# Patient Record
Sex: Male | Born: 1955 | Race: White | Hispanic: No | Marital: Married | Smoking: Never smoker
Health system: Southern US, Community
[De-identification: ages and names within clinical notes are randomized; demographics above are authoritative.]

## PROBLEM LIST (undated history)

## (undated) DIAGNOSIS — E119 Type 2 diabetes mellitus without complications: Secondary | ICD-10-CM

## (undated) DIAGNOSIS — E079 Disorder of thyroid, unspecified: Secondary | ICD-10-CM

## (undated) DIAGNOSIS — I219 Acute myocardial infarction, unspecified: Secondary | ICD-10-CM

## (undated) DIAGNOSIS — J45909 Unspecified asthma, uncomplicated: Secondary | ICD-10-CM

## (undated) DIAGNOSIS — C189 Malignant neoplasm of colon, unspecified: Secondary | ICD-10-CM

## (undated) DIAGNOSIS — I10 Essential (primary) hypertension: Secondary | ICD-10-CM

## (undated) HISTORY — PX: COLON SURGERY: SHX602

## (undated) HISTORY — PX: CORONARY ANGIOPLASTY WITH STENT PLACEMENT: SHX49

## (undated) HISTORY — PX: ANKLE SURGERY: SHX546

---

## 2019-11-24 ENCOUNTER — Encounter (HOSPITAL_BASED_OUTPATIENT_CLINIC_OR_DEPARTMENT_OTHER): Payer: Self-pay

## 2019-11-24 ENCOUNTER — Emergency Department (HOSPITAL_BASED_OUTPATIENT_CLINIC_OR_DEPARTMENT_OTHER): Payer: Medicare HMO

## 2019-11-24 ENCOUNTER — Other Ambulatory Visit: Payer: Self-pay

## 2019-11-24 ENCOUNTER — Emergency Department (HOSPITAL_BASED_OUTPATIENT_CLINIC_OR_DEPARTMENT_OTHER)
Admission: EM | Admit: 2019-11-24 | Discharge: 2019-11-24 | Disposition: A | Discharge status: Home / Self Care | Payer: Medicare HMO | Attending: Emergency Medicine | Admitting: Emergency Medicine

## 2019-11-24 DIAGNOSIS — Z7982 Long term (current) use of aspirin: Secondary | ICD-10-CM | POA: Diagnosis not present

## 2019-11-24 DIAGNOSIS — I10 Essential (primary) hypertension: Secondary | ICD-10-CM | POA: Insufficient documentation

## 2019-11-24 DIAGNOSIS — E119 Type 2 diabetes mellitus without complications: Secondary | ICD-10-CM | POA: Diagnosis not present

## 2019-11-24 DIAGNOSIS — R42 Dizziness and giddiness: Secondary | ICD-10-CM | POA: Insufficient documentation

## 2019-11-24 DIAGNOSIS — R519 Headache, unspecified: Secondary | ICD-10-CM | POA: Diagnosis present

## 2019-11-24 DIAGNOSIS — R0789 Other chest pain: Secondary | ICD-10-CM | POA: Insufficient documentation

## 2019-11-24 DIAGNOSIS — Z85038 Personal history of other malignant neoplasm of large intestine: Secondary | ICD-10-CM | POA: Insufficient documentation

## 2019-11-24 HISTORY — DX: Acute myocardial infarction, unspecified: I21.9

## 2019-11-24 HISTORY — DX: Disorder of thyroid, unspecified: E07.9

## 2019-11-24 HISTORY — DX: Malignant neoplasm of colon, unspecified: C18.9

## 2019-11-24 HISTORY — DX: Type 2 diabetes mellitus without complications: E11.9

## 2019-11-24 HISTORY — DX: Essential (primary) hypertension: I10

## 2019-11-24 LAB — CBC WITH DIFFERENTIAL/PLATELET
Abs Immature Granulocytes: 0.03 10*3/uL (ref 0.00–0.07)
Basophils Absolute: 0.1 10*3/uL (ref 0.0–0.1)
Basophils Relative: 1 %
Eosinophils Absolute: 0.2 10*3/uL (ref 0.0–0.5)
Eosinophils Relative: 2 %
HCT: 41.1 % (ref 39.0–52.0)
Hemoglobin: 14.5 g/dL (ref 13.0–17.0)
Immature Granulocytes: 1 %
Lymphocytes Relative: 20 %
Lymphs Abs: 1.3 10*3/uL (ref 0.7–4.0)
MCH: 31 pg (ref 26.0–34.0)
MCHC: 35.3 g/dL (ref 30.0–36.0)
MCV: 87.8 fL (ref 80.0–100.0)
Monocytes Absolute: 0.5 10*3/uL (ref 0.1–1.0)
Monocytes Relative: 8 %
Neutro Abs: 4.4 10*3/uL (ref 1.7–7.7)
Neutrophils Relative %: 68 %
Platelets: 235 10*3/uL (ref 150–400)
RBC: 4.68 MIL/uL (ref 4.22–5.81)
RDW: 12.2 % (ref 11.5–15.5)
WBC: 6.5 10*3/uL (ref 4.0–10.5)
nRBC: 0 % (ref 0.0–0.2)

## 2019-11-24 LAB — BASIC METABOLIC PANEL
Anion gap: 11 (ref 5–15)
BUN: 14 mg/dL (ref 8–23)
CO2: 26 mmol/L (ref 22–32)
Calcium: 9.2 mg/dL (ref 8.9–10.3)
Chloride: 102 mmol/L (ref 98–111)
Creatinine, Ser: 1 mg/dL (ref 0.61–1.24)
GFR calc Af Amer: >60 mL/min (ref >60)
GFR calc non Af Amer: >60 mL/min (ref >60)
Glucose, Bld: 106 mg/dL — ABNORMAL HIGH (ref 70–99)
Potassium: 3.8 mmol/L (ref 3.5–5.1)
Sodium: 139 mmol/L (ref 135–145)

## 2019-11-24 LAB — TROPONIN I (HIGH SENSITIVITY): Troponin I (High Sensitivity): 5 ng/L (ref <18)

## 2019-11-24 NOTE — Discharge Instructions (Signed)
Please read and follow all provided instructions.  Your diagnoses today include:  1. Acute nonintractable headache, unspecified headache type   2. Chest discomfort   3. Dizziness     Tests performed today include:  CT of your head which was normal and did not show any serious cause of your headache  EKG and cardiac enzyme -did not show any signs of heart attack or stress on the heart  Blood counts and electrolytes  Vital signs. See below for your results today.   Medications:   None  Take any prescribed medications only as directed.  Additional information:  Follow any educational materials contained in this packet.  You are having a headache. No specific cause was found today for your headache. It may have been a migraine or other cause of headache. Stress, anxiety, fatigue, and depression are common triggers for headaches.   Your headache today does not appear to be life-threatening or require hospitalization, but often the exact cause of headaches is not determined in the emergency department. Therefore, follow-up with your doctor is very important to find out what may have caused your headache and whether or not you need any further diagnostic testing or treatment.   Sometimes headaches can appear benign (not harmful), but then more serious symptoms can develop which should prompt an immediate re-evaluation by your doctor or the emergency department.  BE VERY CAREFUL not to take multiple medicines containing Tylenol (also called acetaminophen). Doing so can lead to an overdose which can damage your liver and cause liver failure and possibly death.   Follow-up instructions: Please follow-up with your primary care provider in the next 3 days for further evaluation of your symptoms.   Return instructions:   Please return to the Emergency Department if you experience worsening symptoms.  Return if the medications do not resolve your headache, if it recurs, or if you have  multiple episodes of vomiting or cannot keep down fluids.  Call 9-1-1 if you have new weakness in your arms or legs, slurred speech, trouble walking or talking, confusion, or trouble with your balance.   Return if you have a change from the usual headache.  RETURN IMMEDIATELY IF you:  Develop a sudden, severe headache  Develop confusion or become poorly responsive or faint  Develop a fever above 100.10F or problem breathing  Have a change in speech, vision, swallowing, or understanding  Develop new weakness, numbness, tingling, incoordination in your arms or legs  Have a seizure  Please return if you have any other emergent concerns.  Additional Information:  Your vital signs today were: BP (!) 142/79 (BP Location: Right Arm)   Pulse 63   Temp 97.9 F (36.6 C) (Oral)   Resp 18   Ht 5\' 9"  (1.753 m)   Wt 80.3 kg   SpO2 99%   BMI 26.14 kg/m  If your blood pressure (BP) was elevated above 135/85 this visit, please have this repeated by your doctor within one month. --------------

## 2019-11-24 NOTE — ED Triage Notes (Signed)
Pt c/o intermittent HA, dizziness, blurred vision-sx started 6/30, the day after an eye exam-denies injury-NAD-steady

## 2019-11-24 NOTE — ED Provider Notes (Signed)
[facility] EMERGENCY DEPARTMENT Provider Note   CSN: [id number] Arrival date & time: 11/24/19  1600     History Chief Complaint  Patient presents with  . Headache    [facility] is a 64 y.o. male.  Patient with history of coronary artery disease and MI status post stenting, currently on baby aspirin no anticoagulation, history of hypertension --presents to the emergency department with complaint of intermittent headache, dizziness, blurry vision.  Patient states that he has had difficulty with his eyesight being blurry on the right and his eye feeling 'swollen' since April.  Patient followed up and had a dilated eye exam 3 days ago and was told that he had a cataract.  The following morning he woke up with dizziness described as difficulty walking as well as a spinning sensation.  This was severe but then improved.  Patient states that yesterday developed headache across the front of his head.  This is atypical for him.  He continued to feel very generally weak and lightheaded but has not passed out.  He has had some mild episodes of chest discomfort, not similar to previous pain that he had with his heart attack.  The symptoms are mild.  Patient denies signs of stroke including: facial droop, slurred speech, aphasia, weakness/numbness in extremities.  States that he went to an outside provider and was referred to the emergency department for further evaluation.  They were concerns of potential ischemic/hemorrhagic stroke, ACS.         Past Medical History:  Diagnosis Date  . Colon cancer ([place])   . Diabetes mellitus without complication ([place])   . Heart attack ([place])   . Hypertension   . Thyroid disease     There are no problems to display for this patient.   Past Surgical History:  Procedure Laterality Date  . ANKLE SURGERY    . COLON SURGERY    . CORONARY ANGIOPLASTY WITH STENT PLACEMENT         No family history on file.  Social History   Tobacco Use  .  Smoking status: Never Smoker  . Smokeless tobacco: Never Used  Vaping Use  . Vaping Use: Never used  Substance Use Topics  . Alcohol use: Never  . Drug use: Never    Home Medications Prior to Admission medications   Not on File    Allergies    Flomax [tamsulosin] and Zosyn [piperacillin sod-tazobactam so]  Review of Systems   Review of Systems  Constitutional: Positive for fatigue. Negative for fever.  HENT: Negative for congestion, dental problem, rhinorrhea and sinus pressure.   Eyes: Positive for visual disturbance. Negative for photophobia, discharge and redness.  Respiratory: Negative for shortness of breath.   Cardiovascular: Positive for chest pain.  Gastrointestinal: Negative for nausea and vomiting.  Musculoskeletal: Negative for gait problem, neck pain and neck stiffness.  Skin: Negative for rash.  Neurological: Positive for dizziness, light-headedness and headaches. Negative for seizures, syncope, facial asymmetry, speech difficulty, weakness and numbness.  Psychiatric/Behavioral: Negative for confusion.    Physical Exam Updated Vital Signs BP (!) 142/79 (BP Location: Right Arm)   Pulse 63   Temp 97.9 F (36.6 C) (Oral)   Resp 18   Ht '5\' 9"'  (1.753 m)   Wt 80.3 kg   SpO2 99%   BMI 26.14 kg/m   Physical Exam Vitals and nursing note reviewed.  Constitutional:      Appearance: He is well-developed.  HENT:     Head: Normocephalic  and atraumatic.     Right Ear: Tympanic membrane, ear canal and external ear normal.     Left Ear: Tympanic membrane, ear canal and external ear normal.     Nose: Nose normal.     Mouth/Throat:     Pharynx: Uvula midline.  Eyes:     General: Lids are normal.     Conjunctiva/sclera: Conjunctivae normal.     Pupils: Pupils are equal, round, and reactive to light.     Comments: Patient with normal symmetric pupils, reactive to light.  Negative swinging flashlight test.  Neck:     Comments: No carotid bruits  heard. Cardiovascular:     Rate and Rhythm: Normal rate and regular rhythm.  Pulmonary:     Effort: Pulmonary effort is normal.     Breath sounds: Normal breath sounds.  Abdominal:     Palpations: Abdomen is soft.     Tenderness: There is no abdominal tenderness.  Musculoskeletal:        General: Normal range of motion.     Cervical back: Normal range of motion and neck supple. No tenderness or bony tenderness.  Skin:    General: Skin is warm and dry.  Neurological:     Mental Status: He is alert and oriented to person, place, and time.     GCS: GCS eye subscore is 4. GCS verbal subscore is 5. GCS motor subscore is 6.     Cranial Nerves: No cranial nerve deficit.     Sensory: No sensory deficit.     Motor: No abnormal muscle tone.     Coordination: Coordination normal.     Gait: Gait normal.     Deep Tendon Reflexes: Reflexes are normal and symmetric.     Comments: I met the patient when he was being walked back to the room.  He was walking with a normal gait with normal coordination, actively using his cell phone upon entering the room.     ED Results / Procedures / Treatments   Labs (all labs ordered are listed, but only abnormal results are displayed) Labs Reviewed  BASIC METABOLIC PANEL - Abnormal; Notable for the following components:      Result Value   Glucose, Bld 106 (*)    All other components within normal limits  CBC WITH DIFFERENTIAL/PLATELET  TROPONIN I (HIGH SENSITIVITY)    EKG EKG Interpretation  Date/Time:  Friday November 24 2019 16:08:50 EDT Ventricular Rate:  55 PR Interval:  162 QRS Duration: 80 QT Interval:  428 QTC Calculation: 409 R Axis:   68 Text Interpretation: Sinus bradycardia Otherwise normal ECG No old tracing to compare Confirmed by Isla Pence [phone]) on 11/24/2019 6:08:55 PM   Radiology CT Head Wo Contrast  Result Date: 11/24/2019 CLINICAL DATA:  Headache EXAM: CT HEAD WITHOUT CONTRAST TECHNIQUE: Contiguous axial images were  obtained from the base of the skull through the vertex without intravenous contrast. COMPARISON:  None. FINDINGS: Brain: No acute territorial infarction, hemorrhage or intracranial mass is visualized. Mild to moderate atrophy. Nonenlarged ventricles. Vascular: No hyperdense vessels.  No unexpected calcification Skull: Normal. Negative for fracture or focal lesion. Sinuses/Orbits: No acute finding. Other: None IMPRESSION: 1. No CT evidence for acute intracranial abnormality. 2. Atrophy. Electronically Signed   By: Donavan Foil M.D.   On: 11/24/2019 18:57    Procedures Procedures (including critical care time)  Medications Ordered in ED Medications - No data to display  ED Course  I have reviewed the triage vital signs and  the nursing notes.  Pertinent labs & imaging results that were available during my care of the patient were reviewed by me and considered in my medical decision making (see chart for details).  Patient seen and examined.  Reviewed outside provider note.  Labs ordered upon arrival were reviewed and were normal.  Troponin added.  EKG nonischemic.  Given atypical headache, age greater than 46, CT of the head ordered to evaluate for bleeding.  Patient has a completely normal neurologic exam at time of initial evaluation.  Vital signs reviewed and are as follows: BP (!) 142/79 (BP Location: Right Arm)   Pulse 63   Temp 97.9 F (36.6 C) (Oral)   Resp 18   Ht '5\' 9"'  (1.753 m)   Wt 80.3 kg   SpO2 99%   BMI 26.14 kg/m   8:22 PM patient stable during ED stay.  Reports slight headache currently.  We discussed reassuring head CT, troponin, labs, EKG.  Discussed that the dizziness he had 2 days ago may be related to his eye exam if he was still having pupillary dilation.  However, we did discuss at length, with wife at bedside, possibility of TIA.  I do not feel strongly that patient needs to be admitted for TIA work-up at this time given other potential etiologies of his symptoms,  however did discuss need for close monitoring at home and PCP follow-up.  Patient counseled to return immediately if they have weakness in their arms or legs, slurred speech, trouble walking or talking, confusion, trouble with their balance, or if they have any other concerns. Patient verbalizes understanding and agrees with plan.       MDM Rules/Calculators/A&P                          Patient with multiple symptoms including intermittent headaches, dizziness and trouble walking, lightheadedness, blurred vision.  Recent ophthalmologic work-up.  Blurry vision has been ongoing and patient was recently diagnosed with a cataract in his eye.  Most concerning symptom is patient's reported ataxia which occurred the morning after his eye exam.  This is now resolved.  CT of the head is reassuring.  No signs of bleeding.  Patient reported some chest discomfort.  EKG is nonischemic and troponin is negative x1.  No active pain.  Pain is not similar at all to his previous heart attack.  At this point, I do not feel that the patient requires admission.  However, we discussed signs and symptoms of TIA and stroke and need to return immediately if any of these occur.  Feel patient is low risk for TIA or stroke at this point.  Patient is comfortable discharged home and seems reliable to return if any symptoms get worse or recur.  Wife at bedside is in agreement and will monitor.   Final Clinical Impression(s) / ED Diagnoses Final diagnoses:  Acute nonintractable headache, unspecified headache type  Chest discomfort  Dizziness    Rx / DC Orders ED Discharge Orders    None       Suann Larry 11/24/19 2027    Isla Pence, MD 11/24/19 2041

## 2020-06-08 ENCOUNTER — Emergency Department (HOSPITAL_BASED_OUTPATIENT_CLINIC_OR_DEPARTMENT_OTHER)
Admission: EM | Admit: 2020-06-08 | Discharge: 2020-06-08 | Disposition: A | Discharge status: Home / Self Care | Payer: Medicare HMO | Attending: Emergency Medicine | Admitting: Emergency Medicine

## 2020-06-08 ENCOUNTER — Emergency Department (HOSPITAL_BASED_OUTPATIENT_CLINIC_OR_DEPARTMENT_OTHER): Payer: Medicare HMO

## 2020-06-08 ENCOUNTER — Other Ambulatory Visit: Payer: Self-pay

## 2020-06-08 DIAGNOSIS — Z955 Presence of coronary angioplasty implant and graft: Secondary | ICD-10-CM | POA: Diagnosis not present

## 2020-06-08 DIAGNOSIS — E119 Type 2 diabetes mellitus without complications: Secondary | ICD-10-CM | POA: Insufficient documentation

## 2020-06-08 DIAGNOSIS — R0602 Shortness of breath: Secondary | ICD-10-CM | POA: Diagnosis present

## 2020-06-08 DIAGNOSIS — I2510 Atherosclerotic heart disease of native coronary artery without angina pectoris: Secondary | ICD-10-CM | POA: Diagnosis not present

## 2020-06-08 DIAGNOSIS — I10 Essential (primary) hypertension: Secondary | ICD-10-CM | POA: Diagnosis not present

## 2020-06-08 DIAGNOSIS — Z20822 Contact with and (suspected) exposure to covid-19: Secondary | ICD-10-CM | POA: Insufficient documentation

## 2020-06-08 DIAGNOSIS — Z85038 Personal history of other malignant neoplasm of large intestine: Secondary | ICD-10-CM | POA: Diagnosis not present

## 2020-06-08 DIAGNOSIS — J4541 Moderate persistent asthma with (acute) exacerbation: Secondary | ICD-10-CM | POA: Insufficient documentation

## 2020-06-08 DIAGNOSIS — R0789 Other chest pain: Secondary | ICD-10-CM

## 2020-06-08 HISTORY — DX: Unspecified asthma, uncomplicated: J45.909

## 2020-06-08 LAB — CBC WITH DIFFERENTIAL/PLATELET
Abs Immature Granulocytes: 0.02 10*3/uL (ref 0.00–0.07)
Basophils Absolute: 0.1 10*3/uL (ref 0.0–0.1)
Basophils Relative: 1 %
Eosinophils Absolute: 0.1 10*3/uL (ref 0.0–0.5)
Eosinophils Relative: 1 %
HCT: 39.5 % (ref 39.0–52.0)
Hemoglobin: 14.3 g/dL (ref 13.0–17.0)
Immature Granulocytes: 0 %
Lymphocytes Relative: 18 %
Lymphs Abs: 1.3 10*3/uL (ref 0.7–4.0)
MCH: 30.8 pg (ref 26.0–34.0)
MCHC: 36.2 g/dL — ABNORMAL HIGH (ref 30.0–36.0)
MCV: 85.1 fL (ref 80.0–100.0)
Monocytes Absolute: 0.4 10*3/uL (ref 0.1–1.0)
Monocytes Relative: 6 %
Neutro Abs: 5.3 10*3/uL (ref 1.7–7.7)
Neutrophils Relative %: 74 %
Platelets: 253 10*3/uL (ref 150–400)
RBC: 4.64 MIL/uL (ref 4.22–5.81)
RDW: 12.5 % (ref 11.5–15.5)
WBC: 7.2 10*3/uL (ref 4.0–10.5)
nRBC: 0 % (ref 0.0–0.2)

## 2020-06-08 LAB — BASIC METABOLIC PANEL
Anion gap: 9 (ref 5–15)
BUN: 11 mg/dL (ref 8–23)
CO2: 28 mmol/L (ref 22–32)
Calcium: 9.3 mg/dL (ref 8.9–10.3)
Chloride: 101 mmol/L (ref 98–111)
Creatinine, Ser: 1.11 mg/dL (ref 0.61–1.24)
GFR, Estimated: >60 mL/min (ref >60)
Glucose, Bld: 126 mg/dL — ABNORMAL HIGH (ref 70–99)
Potassium: 3.9 mmol/L (ref 3.5–5.1)
Sodium: 138 mmol/L (ref 135–145)

## 2020-06-08 LAB — SARS CORONAVIRUS 2 (TAT 6-24 HRS): SARS Coronavirus 2: NEGATIVE

## 2020-06-08 LAB — TROPONIN I (HIGH SENSITIVITY)
Troponin I (High Sensitivity): 4 ng/L (ref <18)
Troponin I (High Sensitivity): 4 ng/L (ref <18)

## 2020-06-08 LAB — BRAIN NATRIURETIC PEPTIDE: B Natriuretic Peptide: 22.3 pg/mL (ref 0.0–100.0)

## 2020-06-08 LAB — LACTATE DEHYDROGENASE: LDH: 145 U/L (ref 98–192)

## 2020-06-08 LAB — PROCALCITONIN: Procalcitonin: <0.1 ng/mL

## 2020-06-08 LAB — C-REACTIVE PROTEIN: CRP: <0.5 mg/dL (ref <1.0)

## 2020-06-08 LAB — D-DIMER, QUANTITATIVE: D-Dimer, Quant: 0.4 ug/mL-FEU (ref 0.00–0.50)

## 2020-06-08 MED ORDER — PREDNISONE 50 MG PO TABS: 60.0000 mg | ORAL_TABLET | Freq: Once | ORAL | Status: DC

## 2020-06-08 MED ORDER — PREDNISONE 10 MG PO TABS: 50.0000 mg | ORAL_TABLET | Freq: Every day | ORAL | 0 refills | Status: AC

## 2020-06-08 NOTE — Discharge Instructions (Signed)
We saw you in the ER for chest tightness.   Cardiac work-up is normal.  No evidence of CHF either.  Chest x-ray does not show any pneumonia. We have sent outpatient COVID-19 test, the results should be back by this evening.  We recommend that you isolate until the test results come back negative.  If the test is positive, then you will need to isolate for 5 days, and thereafter use a mask when in public for at least 5 more days at all times.  Please take albuterol as needed every 4 hours. Please take the medications prescribed. Please refrain from smoking or smoke exposure. Please see a primary care doctor in 1 week. Return to the ER if your symptoms worsen.

## 2020-06-08 NOTE — ED Triage Notes (Signed)
SOB with chest tightness since yesterday. Using inhalers without relief.

## 2020-06-08 NOTE — ED Provider Notes (Addendum)
[facility] Provider Note   CSN: [id number] Arrival date & time: 06/08/20  8315     History Chief Complaint  Patient presents with  . Shortness of Breath    [facility] is a 65 y.o. male.  HPI    65 year old male comes in a chief complaint of shortness of breath.  He has history of CAD, hypertension, asthma, diabetes.  Over the last couple of days patient has been having shortness of breath and chest discomfort.  Chest discomfort is central and radiates outwards.  No radiation to the back, shoulder, neck.  Pain is described as burning type pain.  Patient also has shortness of breath with exertion.  Overall he it appears to him that the symptoms are similar to his asthma, however he has taken multiple breathing treatments without any relief.  Patient has received full vaccination against COVID-19.  He denies any known sick exposure. Pt has no hx of PE, DVT and denies any exogenous hormone (testosterone / estrogen) use, long distance travels or surgery in the past 6 weeks, active cancer, recent immobilization. Patient has pain with ACS was heaviness, like something was sitting on him.  Past Medical History:  Diagnosis Date  . Asthma   . Colon cancer ([place])   . Diabetes mellitus without complication ([place])   . Heart attack ([place])   . Hypertension   . Thyroid disease     There are no problems to display for this patient.   Past Surgical History:  Procedure Laterality Date  . ANKLE SURGERY    . COLON SURGERY    . CORONARY ANGIOPLASTY WITH STENT PLACEMENT         No family history on file.  Social History   Tobacco Use  . Smoking status: Never Smoker  . Smokeless tobacco: Never Used  Vaping Use  . Vaping Use: Never used  Substance Use Topics  . Alcohol use: Never  . Drug use: Never    Home Medications Prior to Admission medications   Medication Sig Start Date End Date Taking? Authorizing Provider  predniSONE (DELTASONE) 10 MG  tablet Take 5 tablets (50 mg total) by mouth daily. 06/09/20  Yes Varney Biles, MD    Allergies    Flomax [tamsulosin] and Zosyn [piperacillin sod-tazobactam so]  Review of Systems   Review of Systems  Constitutional: Positive for activity change.  Respiratory: Positive for shortness of breath.   Cardiovascular: Positive for chest pain.  Gastrointestinal: Negative for nausea and vomiting.  Neurological: Negative for dizziness.  All other systems reviewed and are negative.   Physical Exam Updated Vital Signs BP 133/87   Pulse 76   Temp 97.8 F (36.6 C) (Oral)   Resp 13   Ht 5\' 9"  (1.753 m)   Wt 79.4 kg   SpO2 99%   BMI 25.84 kg/m   Physical Exam Vitals and nursing note reviewed.  Constitutional:      Appearance: He is well-developed.  HENT:     Head: Atraumatic.  Cardiovascular:     Rate and Rhythm: Normal rate.  Pulmonary:     Effort: Pulmonary effort is normal.     Breath sounds: No decreased breath sounds or wheezing.  Musculoskeletal:     Cervical back: Neck supple.  Skin:    General: Skin is warm.  Neurological:     Mental Status: He is alert and oriented to person, place, and time.     ED Results / Procedures / Treatments   Labs (  all labs ordered are listed, but only abnormal results are displayed) Labs Reviewed  BASIC METABOLIC PANEL - Abnormal; Notable for the following components:      Result Value   Glucose, Bld 126 (*)    All other components within normal limits  CBC WITH DIFFERENTIAL/PLATELET - Abnormal; Notable for the following components:   MCHC 36.2 (*)    All other components within normal limits  SARS CORONAVIRUS 2 (TAT 6-24 HRS)  D-DIMER, QUANTITATIVE (NOT AT [facility])  C-REACTIVE PROTEIN  BRAIN NATRIURETIC PEPTIDE  LACTATE DEHYDROGENASE  PROCALCITONIN  TROPONIN I (HIGH SENSITIVITY)  TROPONIN I (HIGH SENSITIVITY)    EKG EKG Interpretation  Date/Time:  Saturday June 08 2020 08:38:24 EST Ventricular Rate:  82 PR Interval:     QRS Duration: 95 QT Interval:  389 QTC Calculation: 455 R Axis:   62 Text Interpretation: Sinus rhythm Ventricular trigeminy Abnormal R-wave progression, early transition No acute changes trigeminy is new Confirmed by Varney Biles [phone]) on 06/08/2020 9:09:03 AM   Radiology DG Chest Port 1 View  Result Date: 06/08/2020 CLINICAL DATA:  Shortness of breath and chest tightness 2-4 days. EXAM: PORTABLE CHEST 1 VIEW COMPARISON:  None. FINDINGS: Lungs are adequately inflated and otherwise clear. Cardiomediastinal silhouette, bones and soft tissues are normal. IMPRESSION: No active disease. Electronically Signed   By: Marin Olp M.D.   On: 06/08/2020 12:56    Procedures Procedures (including critical care time)  Medications Ordered in ED Medications  predniSONE (DELTASONE) tablet 60 mg (has no administration in time range)    ED Course  I have reviewed the triage vital signs and the nursing notes.  Pertinent labs & imaging results that were available during my care of the patient were reviewed by me and considered in my medical decision making (see chart for details).    MDM Rules/Calculators/A&P                         [facility] was evaluated in Emergency Department on 06/08/2020 for the symptoms described in the history of present illness. He was evaluated in the context of the global COVID-19 pandemic, which necessitated consideration that the patient might be at risk for infection with the SARS-CoV-2 virus that causes COVID-19. Institutional protocols and algorithms that pertain to the evaluation of patients at risk for COVID-19 are in a state of rapid change based on information released by regulatory bodies including the CDC and federal and state organizations. These policies and algorithms were followed during the patient's care in the ED.    65 year old comes in the chief complaint of chest tightness and shortness of breath.  Symptoms started yesterday.  He has history of  asthma, CAD.  He is vaccinated against COVID-19.  The chest pain is atypical and is not similar to his prior MI type pain.  EKG is not showing any acute ischemia.  There is ventricular trigeminy which is nonspecific.  We will get troponins, but suspicion for ACS is low.  Patient has exertional shortness of breath and feels chest tightness similar to asthma.  No wheezing on exam however.  PE, COVID-19 considered in the differential.  Chest x-ray ordered.  BNP ordered to rule out CHF.  1:07 PM D-dimer and initial high-sensitivity troponin is normal.  Chest x-ray is reassuring. We will order outpatient COVID-19 test.  I will also start patient on prednisone given that he feels like his symptoms are similar to his asthma exacerbation.  No antibiotics  The patient appears reasonably screened and/or stabilized for discharge and I doubt any other medical condition or other [facility] requiring further screening, evaluation, or treatment in the ED at this time prior to discharge.   Results from the ER workup discussed with the patient face to face and all questions answered to the best of my ability. The patient is safe for discharge with strict return precautions.   Final Clinical Impression(s) / ED Diagnoses Final diagnoses:  Moderate persistent asthma with exacerbation  Chest tightness    Rx / DC Orders ED Discharge Orders         Ordered    predniSONE (DELTASONE) 10 MG tablet  Daily        06/08/20 Russell, Otniel Hoe, MD 06/08/20 1158    Varney Biles, MD 06/08/20 1307

## 2020-11-18 ENCOUNTER — Encounter (HOSPITAL_BASED_OUTPATIENT_CLINIC_OR_DEPARTMENT_OTHER): Payer: Self-pay | Admitting: Urology

## 2020-11-18 ENCOUNTER — Emergency Department (HOSPITAL_BASED_OUTPATIENT_CLINIC_OR_DEPARTMENT_OTHER): Payer: Medicare HMO

## 2020-11-18 ENCOUNTER — Other Ambulatory Visit: Payer: Self-pay

## 2020-11-18 ENCOUNTER — Emergency Department (HOSPITAL_BASED_OUTPATIENT_CLINIC_OR_DEPARTMENT_OTHER)
Admission: EM | Admit: 2020-11-18 | Discharge: 2020-11-18 | Disposition: A | Discharge status: Home / Self Care | Payer: Medicare HMO | Attending: Emergency Medicine | Admitting: Emergency Medicine

## 2020-11-18 DIAGNOSIS — I10 Essential (primary) hypertension: Secondary | ICD-10-CM | POA: Insufficient documentation

## 2020-11-18 DIAGNOSIS — J45909 Unspecified asthma, uncomplicated: Secondary | ICD-10-CM | POA: Diagnosis not present

## 2020-11-18 DIAGNOSIS — R0789 Other chest pain: Secondary | ICD-10-CM | POA: Insufficient documentation

## 2020-11-18 DIAGNOSIS — R079 Chest pain, unspecified: Secondary | ICD-10-CM | POA: Diagnosis present

## 2020-11-18 DIAGNOSIS — Z7951 Long term (current) use of inhaled steroids: Secondary | ICD-10-CM | POA: Diagnosis not present

## 2020-11-18 DIAGNOSIS — Z7982 Long term (current) use of aspirin: Secondary | ICD-10-CM | POA: Insufficient documentation

## 2020-11-18 DIAGNOSIS — E119 Type 2 diabetes mellitus without complications: Secondary | ICD-10-CM | POA: Diagnosis not present

## 2020-11-18 DIAGNOSIS — Z85038 Personal history of other malignant neoplasm of large intestine: Secondary | ICD-10-CM | POA: Diagnosis not present

## 2020-11-18 DIAGNOSIS — Z79899 Other long term (current) drug therapy: Secondary | ICD-10-CM | POA: Diagnosis not present

## 2020-11-18 LAB — TROPONIN I (HIGH SENSITIVITY)
Troponin I (High Sensitivity): 4 ng/L (ref <18)
Troponin I (High Sensitivity): 5 ng/L (ref <18)

## 2020-11-18 LAB — BASIC METABOLIC PANEL
Anion gap: 10 (ref 5–15)
BUN: 13 mg/dL (ref 8–23)
CO2: 27 mmol/L (ref 22–32)
Calcium: 8.9 mg/dL (ref 8.9–10.3)
Chloride: 101 mmol/L (ref 98–111)
Creatinine, Ser: 0.97 mg/dL (ref 0.61–1.24)
GFR, Estimated: >60 mL/min (ref >60)
Glucose, Bld: 169 mg/dL — ABNORMAL HIGH (ref 70–99)
Potassium: 3.4 mmol/L — ABNORMAL LOW (ref 3.5–5.1)
Sodium: 138 mmol/L (ref 135–145)

## 2020-11-18 LAB — CBC
HCT: 36.9 % — ABNORMAL LOW (ref 39.0–52.0)
Hemoglobin: 13 g/dL (ref 13.0–17.0)
MCH: 30 pg (ref 26.0–34.0)
MCHC: 35.2 g/dL (ref 30.0–36.0)
MCV: 85.2 fL (ref 80.0–100.0)
Platelets: 239 10*3/uL (ref 150–400)
RBC: 4.33 MIL/uL (ref 4.22–5.81)
RDW: 12.8 % (ref 11.5–15.5)
WBC: 5.7 10*3/uL (ref 4.0–10.5)
nRBC: 0 % (ref 0.0–0.2)

## 2020-11-18 MED ORDER — SODIUM CHLORIDE 0.9 % IV BOLUS
1000.0000 mL | Freq: Once | INTRAVENOUS | Status: AC
  Administered 2020-11-18: 1000 mL via INTRAVENOUS

## 2020-11-18 MED ORDER — KETOROLAC TROMETHAMINE 15 MG/ML IJ SOLN
15.0000 mg | Freq: Once | INTRAMUSCULAR | Status: AC
  Administered 2020-11-18: 15 mg via INTRAVENOUS
  Filled 2020-11-18: qty 1

## 2020-11-18 MED ORDER — POTASSIUM CHLORIDE CRYS ER 20 MEQ PO TBCR
40.0000 meq | EXTENDED_RELEASE_TABLET | Freq: Once | ORAL | Status: AC
  Administered 2020-11-18: 40 meq via ORAL
  Filled 2020-11-18: qty 2

## 2020-11-18 NOTE — ED Triage Notes (Signed)
States left sided chest pain x 1 week after doing yard work, states worsening this morning, took asa 324 and Nitro SL at [employer] [place] with some relief but pain returning.  States intermittent light headedness as well.  H/O MI

## 2020-11-18 NOTE — ED Provider Notes (Addendum)
[facility] DEPARTMENT Provider Note   CSN: [id number] Arrival date & time: 11/18/20  0945     History Chief Complaint  Patient presents with   Chest Pain    Thomas Dillon is a 65 y.o. male.  HPI 65 year old male presents with chest pain.  It has been ongoing for about a week.  At first it was intermittent but now it seems constant.  It started the day after he did a lot of gardening in the yard.  He does not remember any pain during the gardening.  Since then has had a burning type pain in his left anterior chest that goes to the middle.  No radiation of the pain, diaphoresis, nausea, vomiting, shortness of breath.  He has had a heart attack that was about 6 years ago and he thinks this might feel similar but not nearly as bad.  There is no real exertional component.  Does not seem to get worse with certain movements.  This morning he went on a walk and then came back and did gardening and when he got back into the house he noticed the pain was increased at around 6:30 AM.  Took 4 baby aspirin and a nitroglycerin with partial improvement.  Right now the pain is about a 3 out of 10.  Past Medical History:  Diagnosis Date   Asthma    Colon cancer ([place])    Diabetes mellitus without complication ([place])    Heart attack ([place])    Hypertension    Thyroid disease     There are no problems to display for this patient.   Past Surgical History:  Procedure Laterality Date   ANKLE SURGERY     COLON SURGERY     CORONARY ANGIOPLASTY WITH STENT PLACEMENT         History reviewed. No pertinent family history.  Social History   Tobacco Use   Smoking status: Never   Smokeless tobacco: Never  Vaping Use   Vaping Use: Never used  Substance Use Topics   Alcohol use: Never   Drug use: Never    Home Medications Prior to Admission medications   Medication Sig Start Date End Date Taking? Authorizing Provider  amLODipine (NORVASC) 5 MG tablet Take by mouth. 07/17/16   Yes [provider]  atorvastatin (LIPITOR) 40 MG tablet Take 1 tablet by mouth daily. 06/14/20  Yes [provider]  esomeprazole (NEXIUM) 40 MG capsule Take by mouth. 10/21/16  Yes [provider]  famotidine (PEPCID) 20 MG tablet Take by mouth. 11/26/16  Yes [provider]  albuterol (VENTOLIN HFA) 108 (90 Base) MCG/ACT inhaler Inhale 2 puffs into the lungs every 4 (four) hours. 10/19/20   [provider]  aspirin 325 MG EC tablet Take by mouth.    [provider]  predniSONE (DELTASONE) 10 MG tablet Take 5 tablets (50 mg total) by mouth daily. 06/09/20   Varney Biles, MD  TRELEGY ELLIPTA 200-62.5-25 MCG/INH AEPB Inhale 1 puff into the lungs daily. 10/25/20   [provider]    Allergies    Flomax [tamsulosin] and Zosyn [piperacillin sod-tazobactam so]  Review of Systems   Review of Systems  Constitutional:  Negative for diaphoresis and fever.  Respiratory:  Negative for cough and shortness of breath.   Cardiovascular:  Positive for chest pain and leg swelling (chronic, bilateral, positional).  Gastrointestinal:  Negative for abdominal pain, nausea and vomiting.  All other systems reviewed and are negative.  Physical Exam Updated  Vital Signs BP 130/71   Pulse 77   Temp 98.4 F (36.9 C) (Oral)   Resp 15   Ht 5\' 9"  (1.753 m)   Wt 81.6 kg   SpO2 98%   BMI 26.58 kg/m   Physical Exam Vitals and nursing note reviewed.  Constitutional:      Appearance: He is well-developed.  HENT:     Head: Normocephalic and atraumatic.     Right Ear: External ear normal.     Left Ear: External ear normal.     Nose: Nose normal.  Eyes:     General:        Right eye: No discharge.        Left eye: No discharge.  Cardiovascular:     Rate and Rhythm: Normal rate and regular rhythm.     Heart sounds: Normal heart sounds.  Pulmonary:     Effort: Pulmonary effort is normal.     Breath sounds: Normal breath sounds.  Chest:     Chest  wall: Tenderness (mild, over sternum) present.  Abdominal:     Palpations: Abdomen is soft.     Tenderness: There is no abdominal tenderness.  Musculoskeletal:     Cervical back: Neck supple.     Right lower leg: No edema.     Left lower leg: No edema.  Skin:    General: Skin is warm and dry.  Neurological:     Mental Status: He is alert.  Psychiatric:        Mood and Affect: Mood is not anxious.    ED Results / Procedures / Treatments   Labs (all labs ordered are listed, but only abnormal results are displayed) Labs Reviewed  BASIC METABOLIC PANEL - Abnormal; Notable for the following components:      Result Value   Potassium 3.4 (*)    Glucose, Bld 169 (*)    All other components within normal limits  CBC - Abnormal; Notable for the following components:   HCT 36.9 (*)    All other components within normal limits  TROPONIN I (HIGH SENSITIVITY)  TROPONIN I (HIGH SENSITIVITY)    EKG EKG Interpretation  Date/Time:  Monday November 18 2020 12:25:25 EDT Ventricular Rate:  60 PR Interval:  196 QRS Duration: 98 QT Interval:  414 QTC Calculation: 414 R Axis:   60 Text Interpretation: Sinus rhythm no acute ST/T changes similar compared to earlier in the day Confirmed by Sherwood Gambler [phone]) on 11/18/2020 12:36:58 PM  Radiology DG Chest 2 View  Result Date: 11/18/2020 CLINICAL DATA:  Per ED Notes- States left sided chest pain x 1 week after doing yard work, states worsening this morning, took asa 324 and Nitro SL at [employer] with some relief but pain returning. States intermittent light headedness as well. H/O MI, HTN, DiabetesChest pain EXAM: CHEST - 2 VIEW COMPARISON:  None. FINDINGS: Normal mediastinum and cardiac silhouette. Normal pulmonary vasculature. No evidence of effusion, infiltrate, or pneumothorax. No acute bony abnormality. IMPRESSION: No acute cardiopulmonary process. Electronically Signed   By: Suzy Bouchard M.D.   On: 11/18/2020 10:22    Procedures Procedures    Medications Ordered in ED Medications  sodium chloride 0.9 % bolus 1,000 mL (0 mLs Intravenous Stopped 11/18/20 1257)  ketorolac (TORADOL) 15 MG/ML injection 15 mg (15 mg Intravenous Given 11/18/20 1143)  potassium chloride SA (KLOR-CON) CR tablet 40 mEq (40 mEq Oral Given 11/18/20 1142)    ED Course  I have reviewed the triage vital signs  and the nursing notes.  Pertinent labs & imaging results that were available during my care of the patient were reviewed by me and considered in my medical decision making (see chart for details).    MDM Rules/Calculators/A&P                          Troponins are negative x2.  ECGs have remained stable without obvious ischemia.  He is feeling somewhat better after Toradol.  Probably this is not ACS given the length of symptoms and no exertional component.  There is atypical for sure.  However he does have known coronary disease and so I suggested that he needs to follow-up closely with his PCP and/or cardiologist and return if any symptoms arise or worsen. Highly doubt ACS, PE, dissection Final Clinical Impression(s) / ED Diagnoses Final diagnoses:  Nonspecific chest pain    Rx / DC Orders ED Discharge Orders     None        Sherwood Gambler, MD 11/18/20 1315    Sherwood Gambler, MD 11/18/20 1316

## 2021-08-01 IMAGING — DX DG CHEST 2V
2 series · 2 of 2 positions shown · non-contrast
Comparison: None.

CLINICAL DATA: Per ED Notes- States left sided chest pain x 1 week
after doing yard work, states worsening this morning, took asa 324
and Nitro SL at 6216 with some relief but pain returning. States
intermittent light headedness as well. H/O MI, HTN, DiabetesChest
pain

EXAM:
CHEST - 2 VIEW

[chest pa]
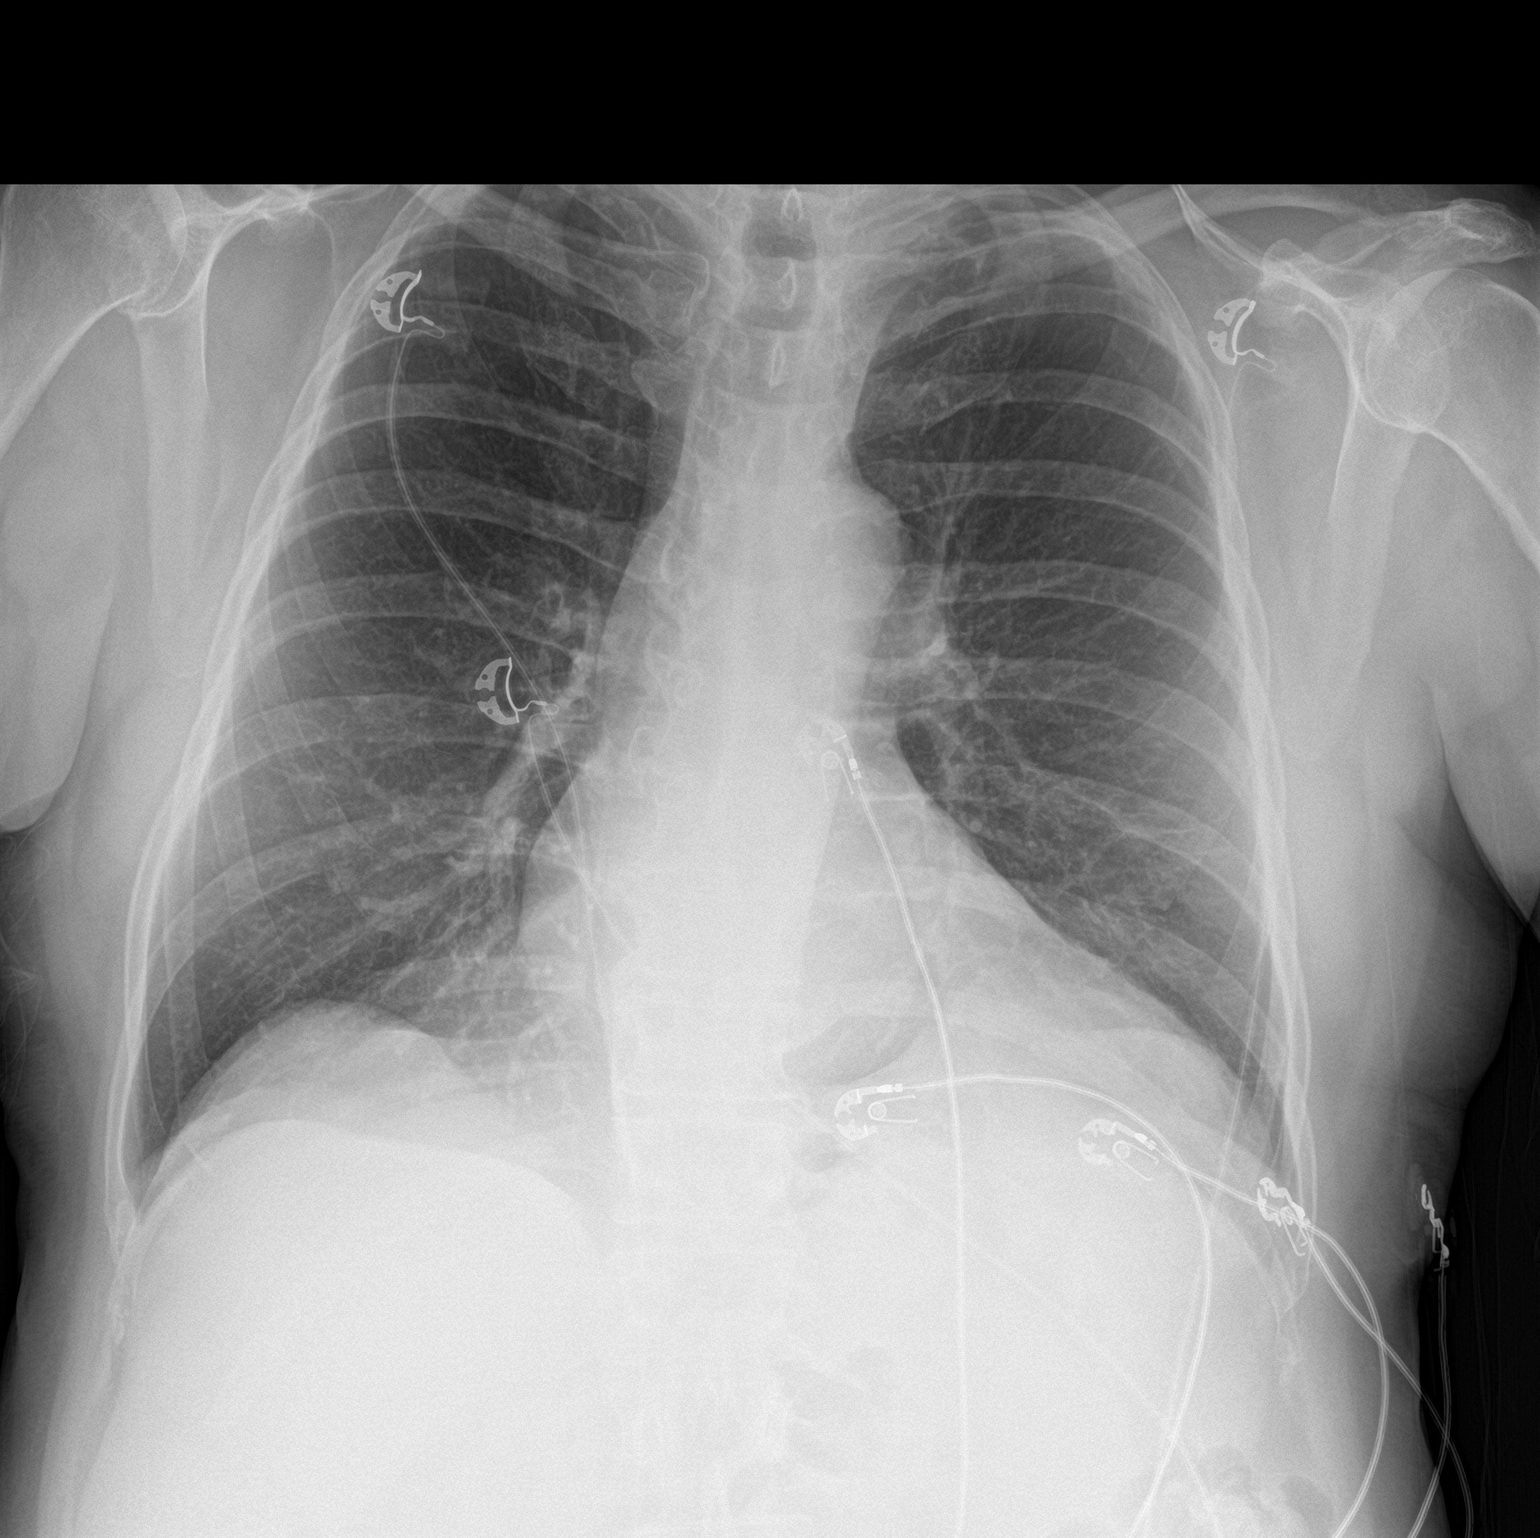

[chest lat]
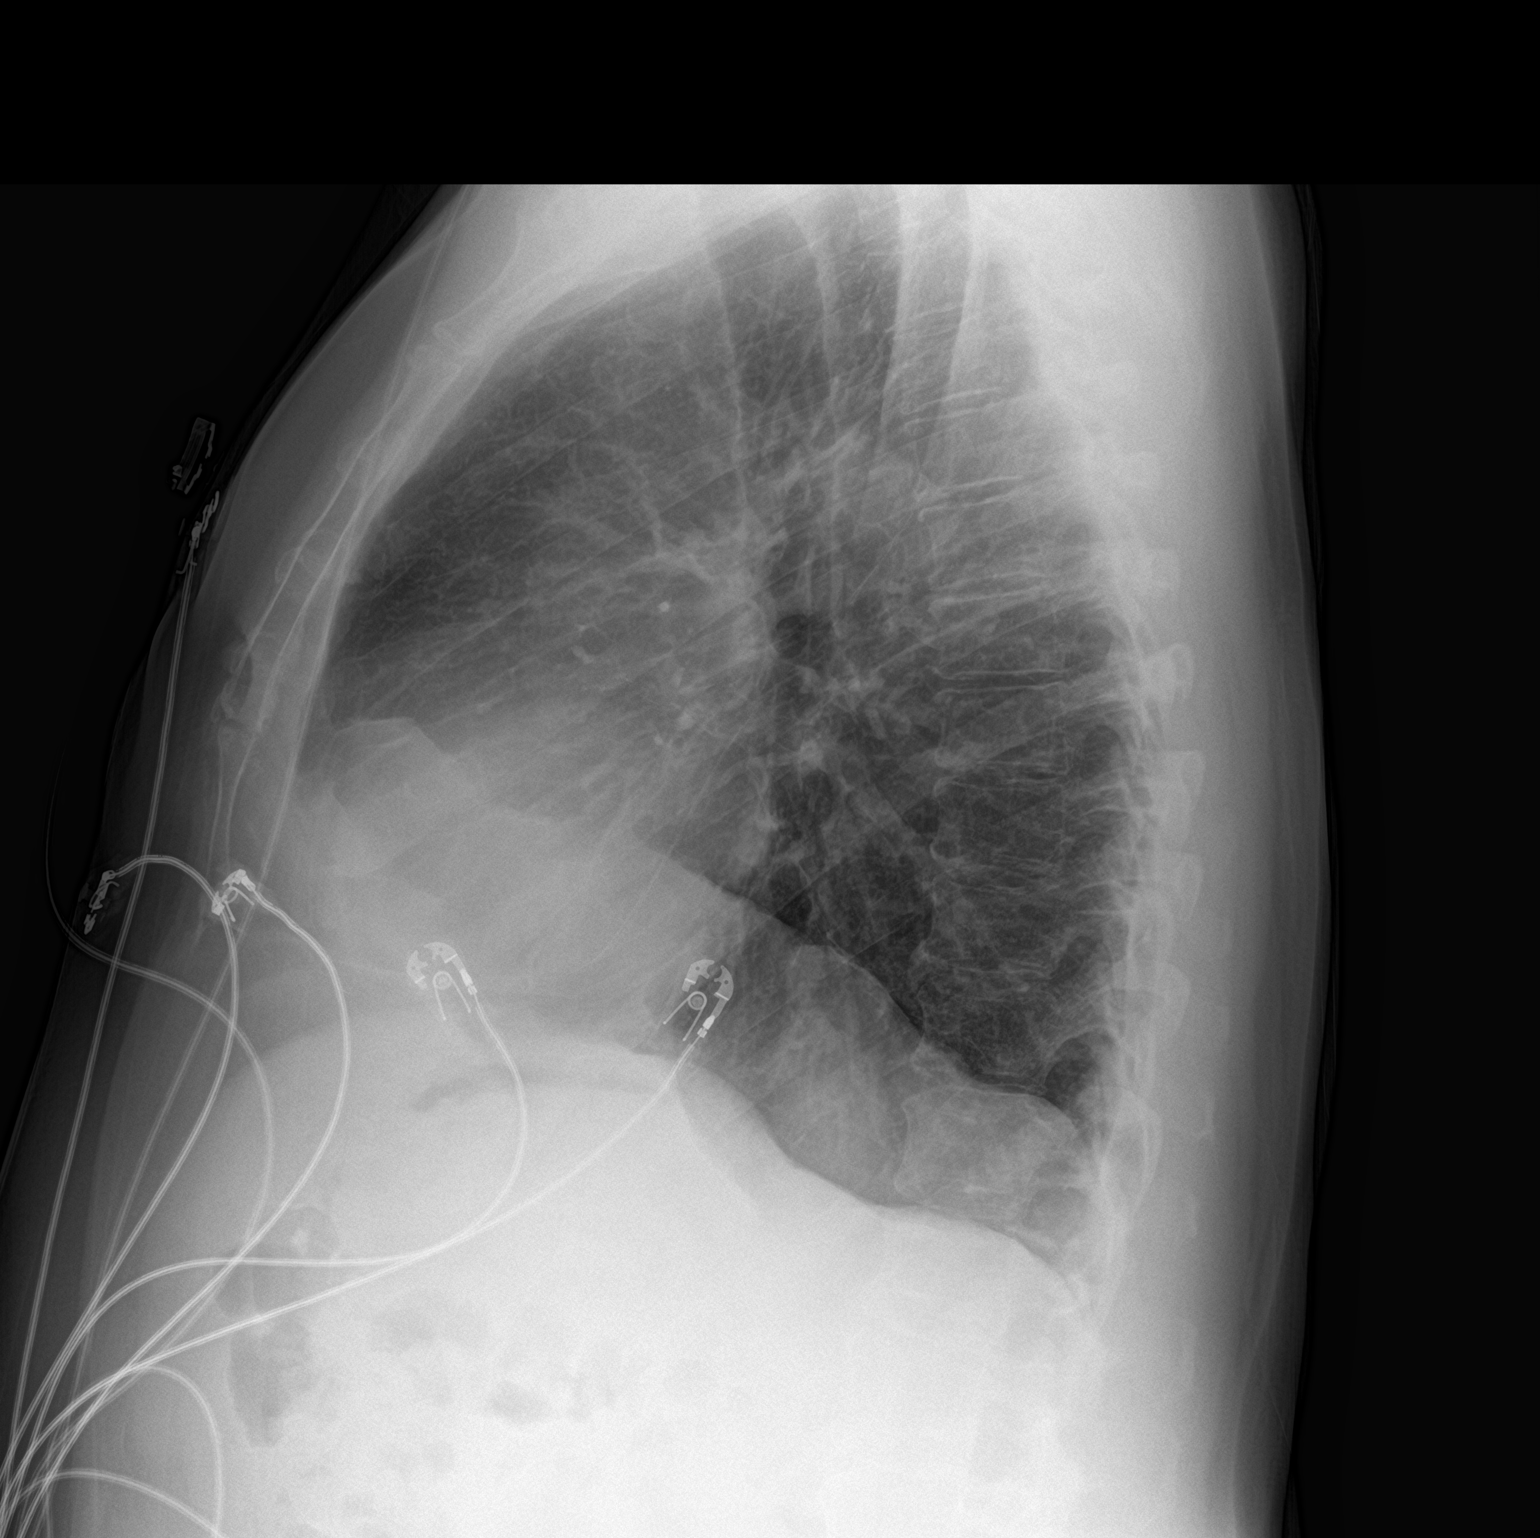

[2 of 2 positions shown; findings below may reference images not displayed]

FINDINGS: Normal mediastinum and cardiac silhouette. Normal pulmonary
vasculature. No evidence of effusion, infiltrate, or pneumothorax.
No acute bony abnormality.
IMPRESSION: No acute cardiopulmonary process.
